# Patient Record
Sex: Female | Born: 1945 | Race: White | Hispanic: No | Marital: Married | Smoking: Never smoker
Health system: Southern US, Community
[De-identification: ages and names within clinical notes are randomized; demographics above are authoritative.]

## PROBLEM LIST (undated history)

## (undated) DIAGNOSIS — E119 Type 2 diabetes mellitus without complications: Secondary | ICD-10-CM

## (undated) DIAGNOSIS — I10 Essential (primary) hypertension: Secondary | ICD-10-CM

## (undated) DIAGNOSIS — E785 Hyperlipidemia, unspecified: Secondary | ICD-10-CM

## (undated) DIAGNOSIS — R0600 Dyspnea, unspecified: Secondary | ICD-10-CM

## (undated) DIAGNOSIS — R7302 Impaired glucose tolerance (oral): Secondary | ICD-10-CM

## (undated) DIAGNOSIS — I499 Cardiac arrhythmia, unspecified: Secondary | ICD-10-CM

## (undated) HISTORY — PX: EYE SURGERY: SHX253

## (undated) HISTORY — PX: COLONOSCOPY: SHX174

---

## 1994-10-03 HISTORY — PX: BREAST EXCISIONAL BIOPSY: SUR124

## 2005-02-24 ENCOUNTER — Ambulatory Visit: Payer: Self-pay | Admitting: Internal Medicine

## 2006-04-10 ENCOUNTER — Ambulatory Visit: Payer: Self-pay | Admitting: Internal Medicine

## 2007-12-04 ENCOUNTER — Ambulatory Visit: Payer: Self-pay | Admitting: Internal Medicine

## 2012-02-09 ENCOUNTER — Ambulatory Visit: Payer: Self-pay | Admitting: Family Medicine

## 2012-10-16 ENCOUNTER — Ambulatory Visit: Payer: Self-pay | Admitting: Unknown Physician Specialty

## 2012-10-17 LAB — PATHOLOGY REPORT

## 2013-02-12 ENCOUNTER — Ambulatory Visit: Payer: Self-pay | Admitting: Family Medicine

## 2017-03-08 ENCOUNTER — Other Ambulatory Visit: Payer: Self-pay | Admitting: Family Medicine

## 2017-03-08 DIAGNOSIS — Z1231 Encounter for screening mammogram for malignant neoplasm of breast: Secondary | ICD-10-CM

## 2017-03-09 ENCOUNTER — Ambulatory Visit
Admission: RE | Admit: 2017-03-09 | Discharge: 2017-03-09 | Disposition: A | Discharge status: Home / Self Care | Payer: Medicare Other | Source: Ambulatory Visit | Attending: Family Medicine | Admitting: Family Medicine

## 2017-03-09 ENCOUNTER — Encounter: Payer: Self-pay | Admitting: Radiology

## 2017-03-09 DIAGNOSIS — Z1231 Encounter for screening mammogram for malignant neoplasm of breast: Secondary | ICD-10-CM

## 2017-03-13 ENCOUNTER — Other Ambulatory Visit: Payer: Self-pay | Admitting: Family Medicine

## 2017-03-13 DIAGNOSIS — R921 Mammographic calcification found on diagnostic imaging of breast: Secondary | ICD-10-CM

## 2017-03-13 DIAGNOSIS — R928 Other abnormal and inconclusive findings on diagnostic imaging of breast: Secondary | ICD-10-CM

## 2017-03-21 ENCOUNTER — Ambulatory Visit
Admission: RE | Admit: 2017-03-21 | Discharge: 2017-03-21 | Disposition: A | Discharge status: Home / Self Care | Payer: Medicare Other | Source: Ambulatory Visit | Attending: Family Medicine | Admitting: Family Medicine

## 2017-03-21 DIAGNOSIS — R921 Mammographic calcification found on diagnostic imaging of breast: Secondary | ICD-10-CM | POA: Insufficient documentation

## 2017-03-21 DIAGNOSIS — R928 Other abnormal and inconclusive findings on diagnostic imaging of breast: Secondary | ICD-10-CM

## 2017-08-10 ENCOUNTER — Other Ambulatory Visit: Payer: Self-pay | Admitting: Family Medicine

## 2017-08-10 DIAGNOSIS — R921 Mammographic calcification found on diagnostic imaging of breast: Secondary | ICD-10-CM

## 2017-09-29 ENCOUNTER — Other Ambulatory Visit: Payer: Self-pay | Admitting: Family Medicine

## 2017-09-29 ENCOUNTER — Ambulatory Visit
Admission: RE | Admit: 2017-09-29 | Discharge: 2017-09-29 | Disposition: A | Discharge status: Home / Self Care | Payer: Medicare Other | Source: Ambulatory Visit | Attending: Family Medicine | Admitting: Family Medicine

## 2017-09-29 DIAGNOSIS — R921 Mammographic calcification found on diagnostic imaging of breast: Secondary | ICD-10-CM

## 2018-01-12 ENCOUNTER — Encounter: Payer: Self-pay | Admitting: *Deleted

## 2018-01-15 ENCOUNTER — Encounter: Payer: Self-pay | Admitting: *Deleted

## 2018-01-15 ENCOUNTER — Ambulatory Visit
Admission: RE | Admit: 2018-01-15 | Discharge: 2018-01-15 | Disposition: A | Discharge status: Home / Self Care | Payer: Medicare Other | Source: Ambulatory Visit | Attending: Unknown Physician Specialty | Admitting: Unknown Physician Specialty

## 2018-01-15 ENCOUNTER — Ambulatory Visit: Payer: Medicare Other | Admitting: Certified Registered"

## 2018-01-15 ENCOUNTER — Encounter
Admission: RE | Disposition: A | Discharge status: Home / Self Care | Payer: Self-pay | Source: Ambulatory Visit | Attending: Unknown Physician Specialty

## 2018-01-15 DIAGNOSIS — Z86010 Personal history of colonic polyps: Secondary | ICD-10-CM | POA: Diagnosis not present

## 2018-01-15 DIAGNOSIS — K640 First degree hemorrhoids: Secondary | ICD-10-CM | POA: Diagnosis not present

## 2018-01-15 DIAGNOSIS — E119 Type 2 diabetes mellitus without complications: Secondary | ICD-10-CM | POA: Diagnosis not present

## 2018-01-15 DIAGNOSIS — Z79899 Other long term (current) drug therapy: Secondary | ICD-10-CM | POA: Diagnosis not present

## 2018-01-15 DIAGNOSIS — K635 Polyp of colon: Secondary | ICD-10-CM | POA: Diagnosis not present

## 2018-01-15 DIAGNOSIS — Z1211 Encounter for screening for malignant neoplasm of colon: Secondary | ICD-10-CM | POA: Diagnosis not present

## 2018-01-15 DIAGNOSIS — K5730 Diverticulosis of large intestine without perforation or abscess without bleeding: Secondary | ICD-10-CM | POA: Diagnosis not present

## 2018-01-15 HISTORY — DX: Hyperlipidemia, unspecified: E78.5

## 2018-01-15 HISTORY — DX: Type 2 diabetes mellitus without complications: E11.9

## 2018-01-15 HISTORY — PX: COLONOSCOPY WITH PROPOFOL: SHX5780

## 2018-01-15 SURGERY — COLONOSCOPY WITH PROPOFOL
Anesthesia: General

## 2018-01-15 MED ORDER — SODIUM CHLORIDE 0.9 % IV SOLN
INTRAVENOUS | Status: DC
  Administered 2018-01-15: 1000 mL via INTRAVENOUS

## 2018-01-15 MED ORDER — LIDOCAINE HCL (CARDIAC) 20 MG/ML IV SOLN
INTRAVENOUS | Status: DC | PRN
  Administered 2018-01-15: 50 mg via INTRAVENOUS

## 2018-01-15 MED ORDER — PROPOFOL 10 MG/ML IV BOLUS
INTRAVENOUS | Status: DC | PRN
  Administered 2018-01-15: 60 mg via INTRAVENOUS
  Administered 2018-01-15 (×2): 20 mg via INTRAVENOUS

## 2018-01-15 MED ORDER — PROPOFOL 500 MG/50ML IV EMUL
INTRAVENOUS | Status: DC | PRN
  Administered 2018-01-15: 130 ug/kg/min via INTRAVENOUS

## 2018-01-15 MED ORDER — SODIUM CHLORIDE 0.9 % IV SOLN: INTRAVENOUS | Status: DC

## 2018-01-15 MED ORDER — PROPOFOL 500 MG/50ML IV EMUL
INTRAVENOUS | Status: AC
  Filled 2018-01-15: qty 50

## 2018-01-15 NOTE — Anesthesia Procedure Notes (Signed)
Performed by: Lillianah Swartzentruber, CRNA Pre-anesthesia Checklist: Patient identified, Emergency Drugs available, Suction available, Patient being monitored and Timeout performed Patient Re-evaluated:Patient Re-evaluated prior to induction Oxygen Delivery Method: Nasal cannula Induction Type: IV induction       

## 2018-01-15 NOTE — H&P (Signed)
Primary Care Physician:  Marina GoodellFeldpausch, Dale E, MD Primary Gastroenterologist:  Dr. Mechele CollinElliott  Pre-Procedure History & Physical: HPI:  Erin Cohen is a 72 y.o. female is here for an colonoscopy.  [facility]   Past Medical History:  Diagnosis Date  . Diabetes mellitus without complication (HCC)   . Lipids blood increased     Past Surgical History:  Procedure Laterality Date  . BREAST BIOPSY Right 1996   neg  . COLONOSCOPY      Prior to Admission medications   Medication Sig Start Date End Date Taking? Authorizing Provider  Biotin 1000 MCG CHEW Chew by mouth.   Yes [provider]  lovastatin (MEVACOR) 10 MG tablet Take 10 mg by mouth at bedtime.   Yes [provider]  Multiple Vitamin (MULTIVITAMIN) tablet Take 1 tablet by mouth daily.   Yes [provider]    Allergies as of 01/12/2018  . (No Known Allergies)    Family History  Problem Relation Age of Onset  . Breast cancer Cousin        pat cousin    Social History   Socioeconomic History  . Marital status: Married    Spouse name: Not on file  . Number of children: Not on file  . Years of education: Not on file  . Highest education level: Not on file  Occupational History  . Not on file  Social Needs  . Financial resource strain: Not on file  . Food insecurity:    Worry: Not on file    Inability: Not on file  . Transportation needs:    Medical: Not on file    Non-medical: Not on file  Tobacco Use  . Smoking status: Never Smoker  . Smokeless tobacco: Never Used  Substance and Sexual Activity  . Alcohol use: Never    Frequency: Never  . Drug use: Never  . Sexual activity: Not on file  Lifestyle  . Physical activity:    Days per week: Not on file    Minutes per session: Not on file  . Stress: Not on file  Relationships  . Social connections:    Talks on phone: Not on file    Gets together: Not on file    Attends religious service: Not on file    Active  member of club or organization: Not on file    Attends meetings of clubs or organizations: Not on file    Relationship status: Not on file  . Intimate partner violence:    Fear of current or ex partner: Not on file    Emotionally abused: Not on file    Physically abused: Not on file    Forced sexual activity: Not on file  Other Topics Concern  . Not on file  Social History Narrative  . Not on file    Review of Systems: See HPI, otherwise negative ROS  Physical Exam: Pulse 78   Temp 98.8 F (37.1 C) (Tympanic)   Resp 20   Ht 5\' 2"  (1.575 m)   Wt 70.3 kg (155 lb)   SpO2 97%   BMI 28.35 kg/m  General:   Alert,  pleasant and cooperative in NAD Head:  Normocephalic and atraumatic. Neck:  Supple; no masses or thyromegaly. Lungs:  Clear throughout to auscultation.    Heart:  Regular rate and rhythm. Abdomen:  Soft, nontender and nondistended. Normal bowel sounds, without guarding, and without rebound.   Neurologic:  Alert and  oriented  x4;  grossly normal neurologically.  Impression/Plan: Erin Cohen is here for an colonoscopy to be performed for [facility] colon polyps  Risks, benefits, limitations, and alternatives regarding  colonoscopy have been reviewed with the patient.  Questions have been answered.  All parties agreeable.   Lynnae Prude, MD  01/15/2018, 2:30 PM

## 2018-01-15 NOTE — Anesthesia Preprocedure Evaluation (Signed)
Anesthesia Evaluation  Patient identified by MRN, date of birth, ID band Patient awake    Reviewed: Allergy & Precautions, NPO status , Patient's Chart, lab work & pertinent test results, reviewed documented beta blocker date and time   Airway Mallampati: II  TM Distance: >3 FB     Dental  (+) Chipped   Pulmonary           Cardiovascular      Neuro/Psych    GI/Hepatic   Endo/Other  diabetes, Type 2  Renal/GU      Musculoskeletal   Abdominal   Peds  Hematology   Anesthesia Other Findings   Reproductive/Obstetrics                             Anesthesia Physical Anesthesia Plan  ASA: II  Anesthesia Plan: General   Post-op Pain Management:    Induction: Intravenous  PONV Risk Score and Plan:   Airway Management Planned:   Additional Equipment:   Intra-op Plan:   Post-operative Plan:   Informed Consent: I have reviewed the patients History and Physical, chart, labs and discussed the procedure including the risks, benefits and alternatives for the proposed anesthesia with the patient or authorized representative who has indicated his/her understanding and acceptance.     Plan Discussed with: CRNA  Anesthesia Plan Comments:         Anesthesia Quick Evaluation  

## 2018-01-15 NOTE — Transfer of Care (Signed)
Immediate Anesthesia Transfer of Care Note  Patient: Erin Cohen  Procedure(s) Performed: COLONOSCOPY WITH PROPOFOL (N/A )  Patient Location: PACU    Anesthesia Type:General  Level of Consciousness: sedated  Airway & Oxygen Therapy: Patient Spontanous Breathing and Patient connected to nasal cannula oxygen  Post-op Assessment: Report given to RN and Post -op Vital signs reviewed and stable  Post vital signs: Reviewed and stable  Last Vitals:  Vitals Value Taken Time  BP 129/60 01/15/2018  2:58 PM  Temp    Pulse 72 01/15/2018  2:59 PM  Resp 12 01/15/2018  2:59 PM  SpO2 99 % 01/15/2018  2:59 PM  Vitals shown include unvalidated device data.  Last Pain:  Vitals:   01/15/18 1354  TempSrc: Tympanic      Patients Stated Pain Goal: 0 (01/15/18 1354)  Complications: No apparent anesthesia complications

## 2018-01-15 NOTE — Anesthesia Postprocedure Evaluation (Signed)
Anesthesia Post Note  Patient: Erin Cohen  Procedure(s) Performed: COLONOSCOPY WITH PROPOFOL (N/A )  Patient location during evaluation: Endoscopy Anesthesia Type: General Level of consciousness: awake and alert Pain management: pain level controlled Vital Signs Assessment: post-procedure vital signs reviewed and stable Respiratory status: spontaneous breathing and respiratory function stable Cardiovascular status: stable Anesthetic complications: no     Last Vitals:  Vitals:   01/15/18 1459 01/15/18 1500  BP: 129/60   Pulse: 73 72  Resp: 12 12  Temp: (!) 36.1 C   SpO2: 99% 99%    Last Pain:  Vitals:   01/15/18 1459  TempSrc: Tympanic  PainSc: 0-No pain                 Adelaida Reindel K

## 2018-01-15 NOTE — Op Note (Signed)
[facility] Gastroenterology Patient Name: Erin HoyerKay Cohen Procedure Date: 01/15/2018 2:31 PM MRN: [id number] Account #: [id number] Date of Birth: 11/27/1945 Admit Type: Outpatient Age: [age] [facility] 1 Gender: Female Note Status: Finalized Procedure:            Colonoscopy Indications:          High risk colon cancer surveillance: Personal history                        of colonic polyps Providers:            Scot Junobert T. Elliott, MD Referring MD:         Marina Goodellale E. Feldpausch (Referring MD) Medicines:            Propofol per Anesthesia Complications:        No immediate complications. Procedure:            Pre-Anesthesia Assessment:                       - After reviewing the risks and benefits, the patient                        was deemed in satisfactory condition to undergo the                        procedure.                       After obtaining informed consent, the colonoscope was                        passed under direct vision. Throughout the procedure,                        the patient's blood pressure, pulse, and oxygen                        saturations were monitored continuously. The                        Colonoscope was introduced through the anus and                        advanced to the the cecum, identified by appendiceal                        orifice and ileocecal valve. The colonoscopy was                        performed without difficulty. The patient tolerated the                        procedure well. The quality of the bowel preparation                        was excellent. Findings:      A diminutive polyp was found in the ascending colon. The polyp was       sessile. The polyp was removed with a jumbo cold forceps. Resection and       retrieval were complete.      A few small-mouthed diverticula were  found in the sigmoid colon and       descending colon.      Internal hemorrhoids were found during endoscopy. The hemorrhoids  were       small and Grade I (internal hemorrhoids that do not prolapse).      The exam was otherwise without abnormality. Impression:           - One diminutive polyp in the ascending colon, removed                        with a jumbo cold forceps. Resected and retrieved.                       - Diverticulosis in the sigmoid colon and in the                        descending colon.                       - Internal hemorrhoids.                       - The examination was otherwise normal. Recommendation:       - Await pathology results. Procedure Code(s):    --- Professional ---                       [id number], Colonoscopy, flexible; with biopsy, single or                        multiple Diagnosis Code(s):    --- Professional ---                       Z86.010, Personal history of colonic polyps                       D12.2, Benign neoplasm of ascending colon                       K64.0, First degree hemorrhoids                       K57.30, Diverticulosis of large intestine without                        perforation or abscess without bleeding CPT copyright 2017 American Medical Association. All rights reserved. The codes documented in this report are preliminary and upon coder review may  be revised to meet current compliance requirements. Scot Jun, MD 01/15/2018 2:57:05 PM This report has been signed electronically. Number of Addenda: 0 Note Initiated On: 01/15/2018 2:31 PM Scope Withdrawal Time: 0 hours 8 minutes 37 seconds  Total Procedure Duration: 0 hours 16 minutes 55 seconds       [facility]

## 2018-01-15 NOTE — Anesthesia Post-op Follow-up Note (Signed)
Anesthesia QCDR form completed.        

## 2018-01-17 ENCOUNTER — Encounter: Payer: Self-pay | Admitting: Unknown Physician Specialty

## 2018-01-18 LAB — SURGICAL PATHOLOGY

## 2018-02-22 ENCOUNTER — Other Ambulatory Visit: Payer: Self-pay | Admitting: Family Medicine

## 2018-02-22 DIAGNOSIS — Z1231 Encounter for screening mammogram for malignant neoplasm of breast: Secondary | ICD-10-CM

## 2018-03-01 ENCOUNTER — Other Ambulatory Visit: Payer: Self-pay | Admitting: Family Medicine

## 2018-03-01 DIAGNOSIS — R921 Mammographic calcification found on diagnostic imaging of breast: Secondary | ICD-10-CM

## 2018-03-01 DIAGNOSIS — Z1231 Encounter for screening mammogram for malignant neoplasm of breast: Secondary | ICD-10-CM

## 2018-04-02 ENCOUNTER — Other Ambulatory Visit: Payer: Self-pay | Admitting: Family Medicine

## 2018-04-02 ENCOUNTER — Ambulatory Visit
Admission: RE | Admit: 2018-04-02 | Discharge: 2018-04-02 | Disposition: A | Discharge status: Home / Self Care | Payer: Medicare Other | Source: Ambulatory Visit | Attending: Family Medicine | Admitting: Family Medicine

## 2018-04-02 DIAGNOSIS — Z1231 Encounter for screening mammogram for malignant neoplasm of breast: Secondary | ICD-10-CM

## 2018-04-02 DIAGNOSIS — R921 Mammographic calcification found on diagnostic imaging of breast: Secondary | ICD-10-CM

## 2019-05-10 ENCOUNTER — Other Ambulatory Visit: Payer: Self-pay | Admitting: Family Medicine

## 2019-05-10 DIAGNOSIS — Z1231 Encounter for screening mammogram for malignant neoplasm of breast: Secondary | ICD-10-CM

## 2019-05-24 ENCOUNTER — Other Ambulatory Visit: Payer: Self-pay | Admitting: Family Medicine

## 2019-05-24 DIAGNOSIS — Z1231 Encounter for screening mammogram for malignant neoplasm of breast: Secondary | ICD-10-CM

## 2019-05-28 ENCOUNTER — Other Ambulatory Visit: Payer: Self-pay | Admitting: Family Medicine

## 2019-05-28 DIAGNOSIS — R921 Mammographic calcification found on diagnostic imaging of breast: Secondary | ICD-10-CM

## 2019-07-02 ENCOUNTER — Ambulatory Visit
Admission: RE | Admit: 2019-07-02 | Discharge: 2019-07-02 | Disposition: A | Discharge status: Home / Self Care | Payer: Medicare Other | Source: Ambulatory Visit | Attending: Family Medicine | Admitting: Family Medicine

## 2019-07-02 DIAGNOSIS — R921 Mammographic calcification found on diagnostic imaging of breast: Secondary | ICD-10-CM | POA: Diagnosis not present

## 2020-12-03 IMAGING — MG MM DIGITAL DIAGNOSTIC BILAT W/ TOMO W/ CAD
6 of 10 series · 6 of 26 positions shown · non-contrast
Comparison: Previous exam(s).

CLINICAL DATA: Patient presents for bilateral diagnostic
examination to follow-up right breast microcalcifications.

EXAM:
DIGITAL DIAGNOSTIC BILATERAL MAMMOGRAM WITH CAD AND TOMO

[R ML]
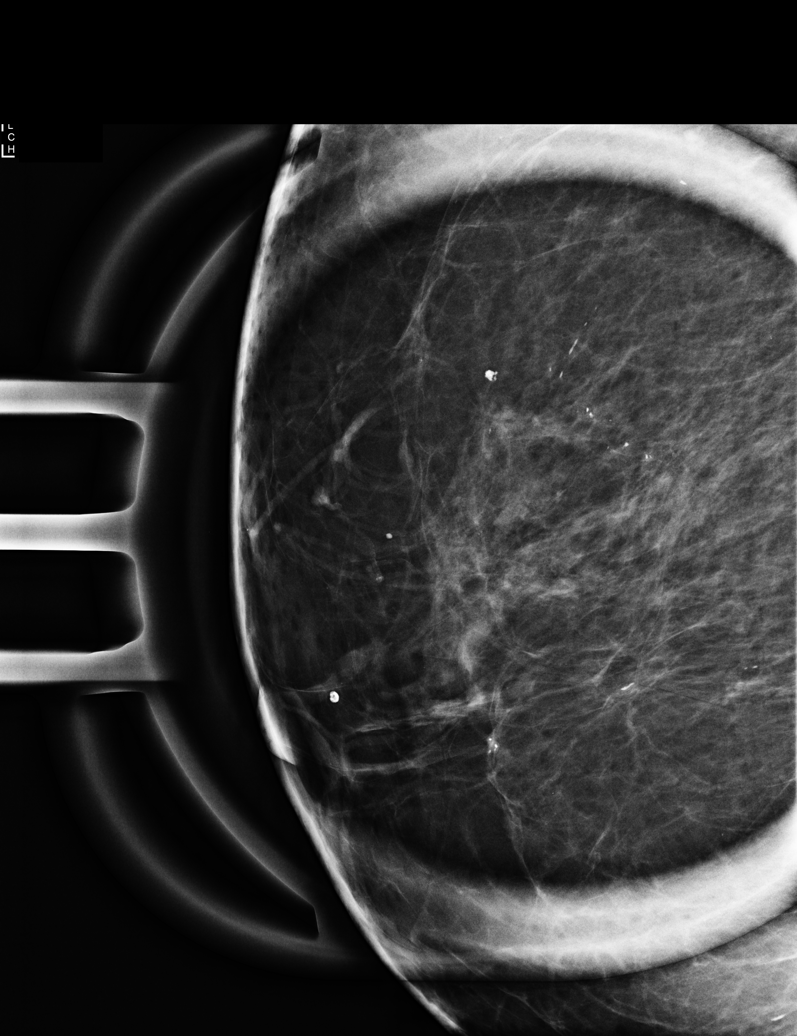

[R CC]
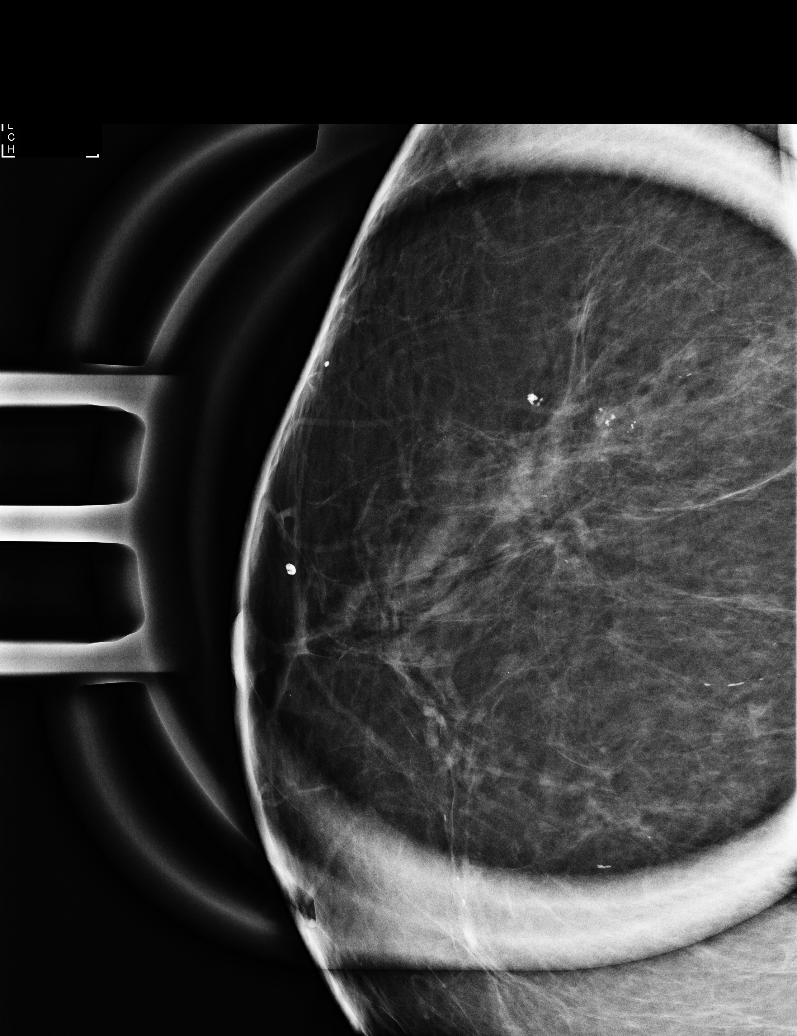

[L MLO synth-2D]
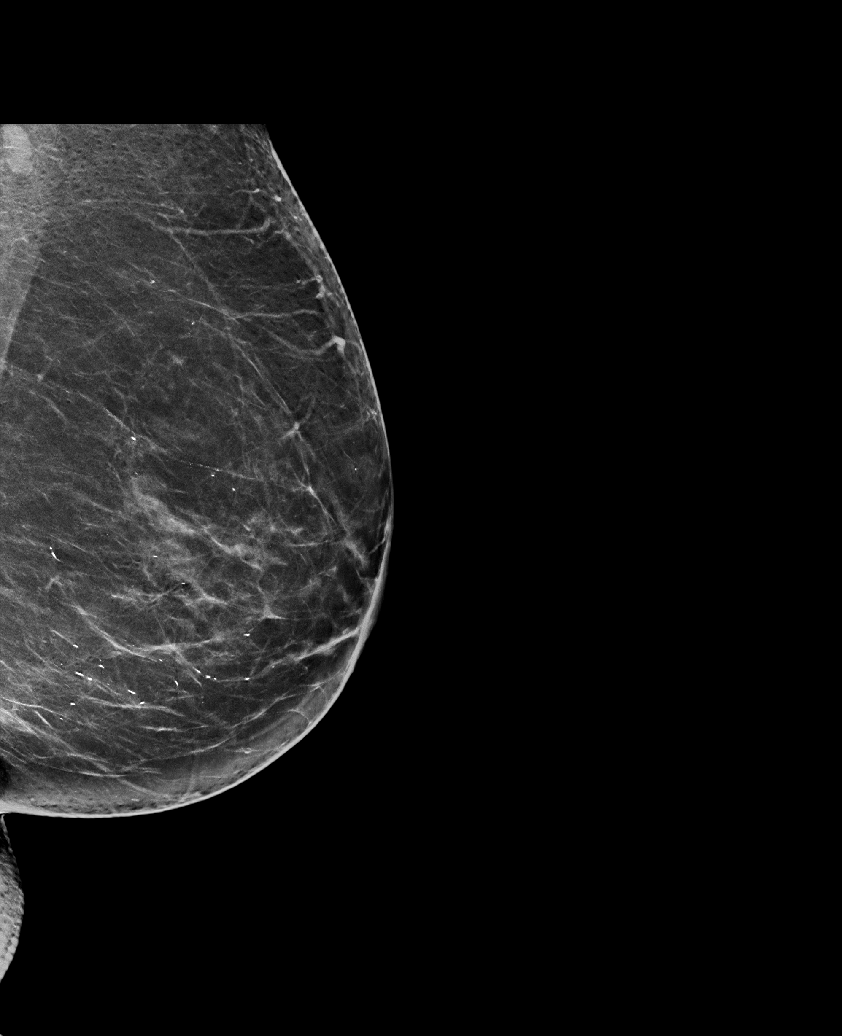

[R CC synth-2D]
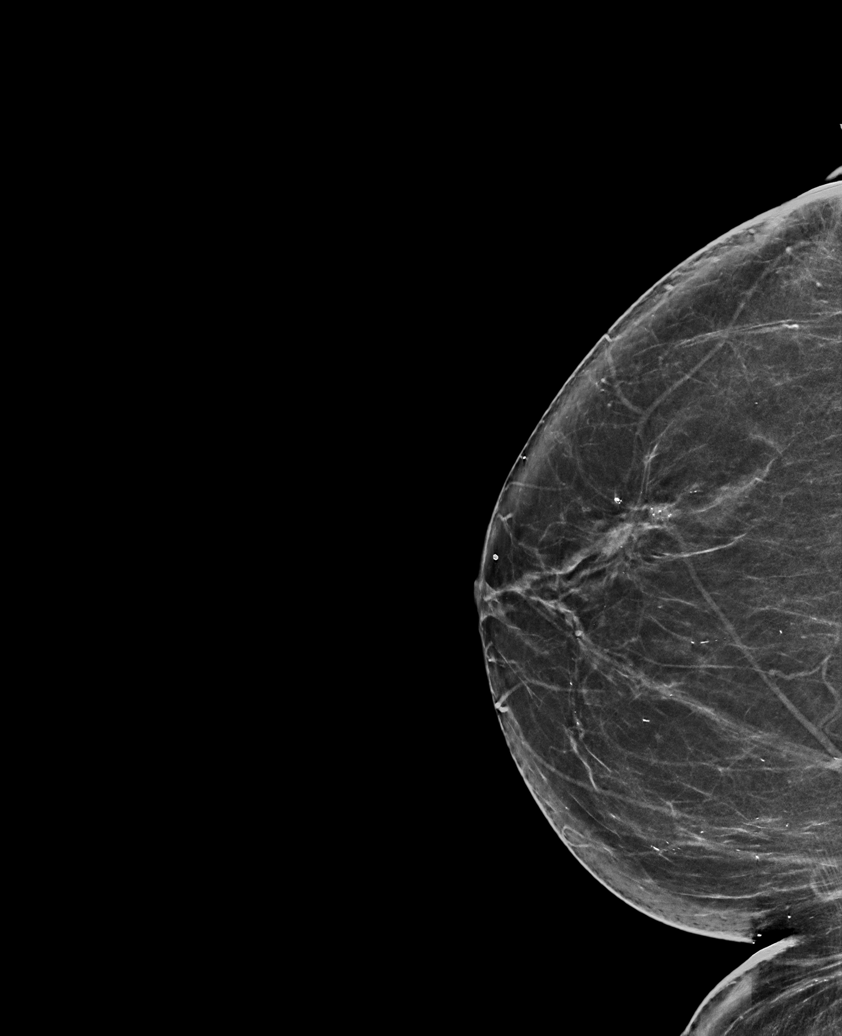

[R MLO synth-2D]
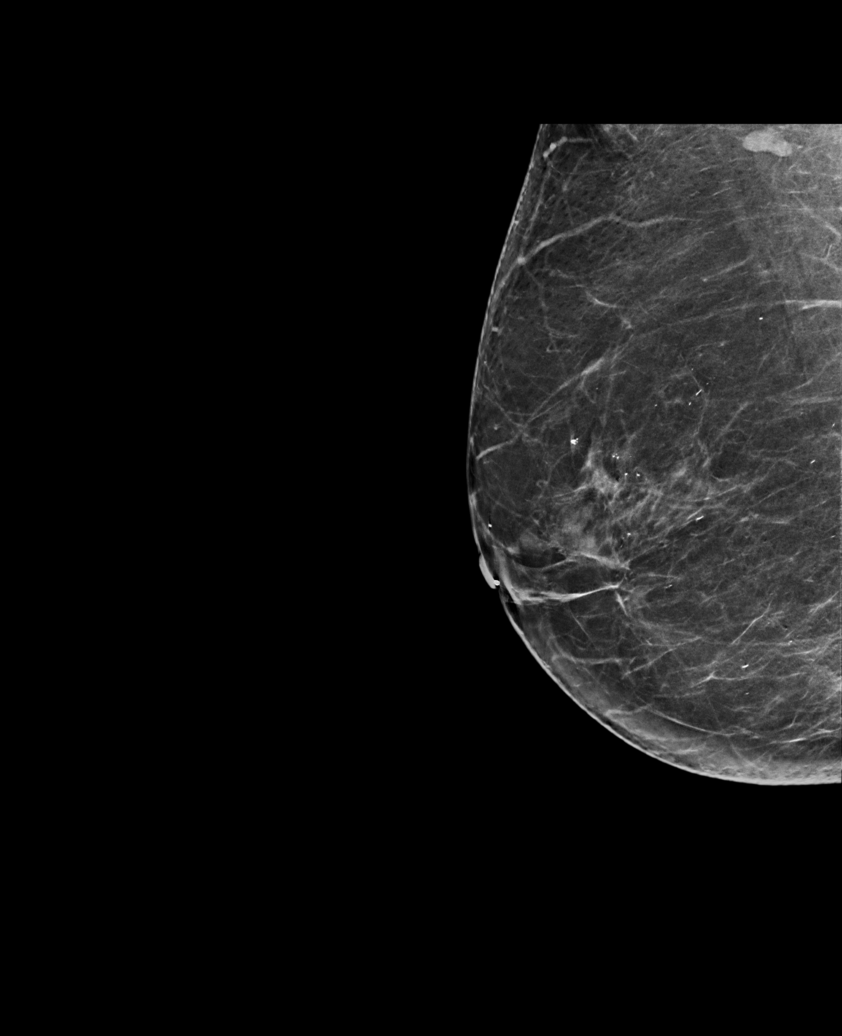

[L CC synth-2D]
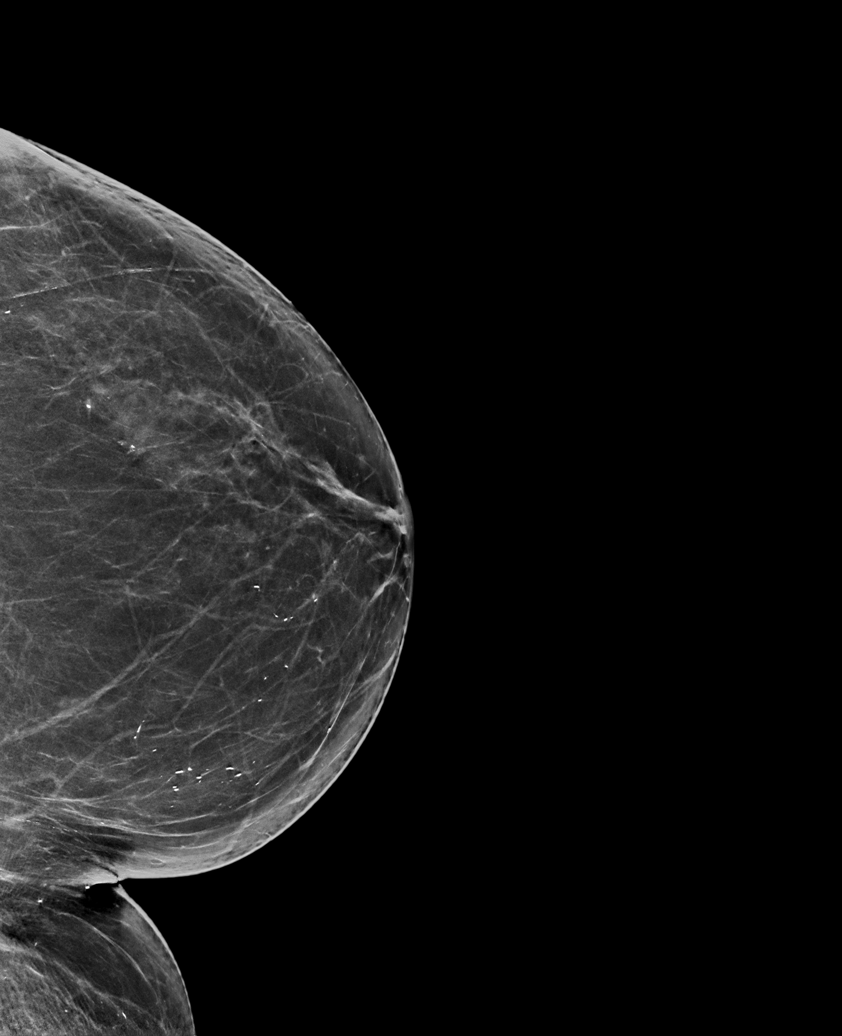

[6 of 26 positions shown; findings below may reference images not displayed]

ACR Breast Density Category b: There are scattered areas of
fibroglandular density.
FINDINGS: Examination demonstrates a stable group of microcalcifications over
the upper outer right breast in the area of a previous excisional
biopsy site. These have been stable for greater than 2 years and
therefore are considered benign. Remainder of the right breast as
well as the left breast is unchanged.

Mammographic images were processed with CAD.
IMPRESSION: Right breast microcalcifications stable for greater than 2 years and
therefore considered benign.

RECOMMENDATION:
Recommend continued annual bilateral screening mammographic
follow-up.

I have discussed the findings and recommendations with the patient.
If applicable, a reminder letter will be sent to the patient
regarding the next appointment.

BI-RADS CATEGORY  2: Benign.

## 2022-10-03 HISTORY — PX: PACEMAKER IMPLANT: EP1218

## 2023-07-14 ENCOUNTER — Encounter: Payer: Self-pay | Admitting: *Deleted

## 2023-07-24 ENCOUNTER — Ambulatory Visit
Admission: RE | Admit: 2023-07-24 | Discharge: 2023-07-24 | Disposition: A | Discharge status: Home / Self Care | Payer: Medicare Other | Attending: Gastroenterology | Admitting: Gastroenterology

## 2023-07-24 ENCOUNTER — Ambulatory Visit: Payer: Medicare Other | Admitting: Anesthesiology

## 2023-07-24 ENCOUNTER — Encounter
Admission: RE | Disposition: A | Discharge status: Home / Self Care | Payer: Self-pay | Source: Home / Self Care | Attending: Gastroenterology

## 2023-07-24 ENCOUNTER — Encounter: Payer: Self-pay | Admitting: *Deleted

## 2023-07-24 DIAGNOSIS — I10 Essential (primary) hypertension: Secondary | ICD-10-CM | POA: Insufficient documentation

## 2023-07-24 DIAGNOSIS — D175 Benign lipomatous neoplasm of intra-abdominal organs: Secondary | ICD-10-CM | POA: Diagnosis not present

## 2023-07-24 DIAGNOSIS — E785 Hyperlipidemia, unspecified: Secondary | ICD-10-CM | POA: Insufficient documentation

## 2023-07-24 DIAGNOSIS — K640 First degree hemorrhoids: Secondary | ICD-10-CM | POA: Diagnosis not present

## 2023-07-24 DIAGNOSIS — D123 Benign neoplasm of transverse colon: Secondary | ICD-10-CM | POA: Insufficient documentation

## 2023-07-24 DIAGNOSIS — Z1211 Encounter for screening for malignant neoplasm of colon: Secondary | ICD-10-CM | POA: Diagnosis present

## 2023-07-24 DIAGNOSIS — K5730 Diverticulosis of large intestine without perforation or abscess without bleeding: Secondary | ICD-10-CM | POA: Insufficient documentation

## 2023-07-24 DIAGNOSIS — E119 Type 2 diabetes mellitus without complications: Secondary | ICD-10-CM | POA: Diagnosis not present

## 2023-07-24 HISTORY — PX: POLYPECTOMY: SHX5525

## 2023-07-24 HISTORY — PX: COLONOSCOPY WITH PROPOFOL: SHX5780

## 2023-07-24 HISTORY — DX: Hyperlipidemia, unspecified: E78.5

## 2023-07-24 HISTORY — DX: Cardiac arrhythmia, unspecified: I49.9

## 2023-07-24 HISTORY — DX: Impaired glucose tolerance (oral): R73.02

## 2023-07-24 HISTORY — DX: Dyspnea, unspecified: R06.00

## 2023-07-24 HISTORY — DX: Essential (primary) hypertension: I10

## 2023-07-24 LAB — GLUCOSE, CAPILLARY: Glucose-Capillary: 100 mg/dL — ABNORMAL HIGH (ref 70–99)

## 2023-07-24 SURGERY — COLONOSCOPY WITH PROPOFOL
Anesthesia: General

## 2023-07-24 MED ORDER — SODIUM CHLORIDE 0.9 % IV SOLN: INTRAVENOUS | Status: DC

## 2023-07-24 MED ORDER — LIDOCAINE HCL (CARDIAC) PF 100 MG/5ML IV SOSY
PREFILLED_SYRINGE | INTRAVENOUS | Status: DC | PRN
  Administered 2023-07-24: 50 mg via INTRAVENOUS

## 2023-07-24 MED ORDER — PROPOFOL 500 MG/50ML IV EMUL
INTRAVENOUS | Status: DC | PRN
  Administered 2023-07-24: 80 mg via INTRAVENOUS
  Administered 2023-07-24: 150 ug/kg/min via INTRAVENOUS

## 2023-07-24 NOTE — Op Note (Signed)
[facility] Gastroenterology Patient Name: Erin Cohen Procedure Date: 07/24/2023 12:04 PM MRN: [id number] Account #: [id number] Date of Birth: [birth year] Admit Type: Outpatient Age: 77 Room: [facility] ENDO ROOM 3 Gender: Female Note Status: Finalized Instrument Name: Prentice Docker [id number] Procedure:             Colonoscopy Indications:           High risk colon cancer surveillance: Personal history                         of colonic polyps, Last colonoscopy 5 years ago Providers:             Eather Colas MD, MD Referring MD:          Marina Goodell (Referring MD) Medicines:             Monitored Anesthesia Care Complications:         No immediate complications. Estimated blood loss:                         Minimal. Procedure:             Pre-Anesthesia Assessment:                        - Prior to the procedure, a History and Physical was                         performed, and patient medications and allergies were                         reviewed. The patient is competent. The risks and                         benefits of the procedure and the sedation options and                         risks were discussed with the patient. All questions                         were answered and informed consent was obtained.                         Patient identification and proposed procedure were                         verified by the physician, the nurse, the                         anesthesiologist, the anesthetist and the technician                         in the endoscopy suite. Mental Status Examination:                         alert and oriented. Airway Examination: normal                         oropharyngeal airway and neck mobility. Respiratory  Examination: clear to auscultation. CV Examination:                         normal. Prophylactic Antibiotics: The patient does not                         require prophylactic antibiotics. Prior                          Anticoagulants: The patient has taken no anticoagulant                         or antiplatelet agents. ASA Grade Assessment: II - A                         patient with mild systemic disease. After reviewing                         the risks and benefits, the patient was deemed in                         satisfactory condition to undergo the procedure. The                         anesthesia plan was to use monitored anesthesia care                         (MAC). Immediately prior to administration of                         medications, the patient was re-assessed for adequacy                         to receive sedatives. The heart rate, respiratory                         rate, oxygen saturations, blood pressure, adequacy of                         pulmonary ventilation, and response to care were                         monitored throughout the procedure. The physical                         status of the patient was re-assessed after the                         procedure.                        After obtaining informed consent, the colonoscope was                         passed under direct vision. Throughout the procedure,                         the patient's blood pressure, pulse, and oxygen  saturations were monitored continuously. The                         Colonoscope was introduced through the anus and                         advanced to the the cecum, identified by appendiceal                         orifice and ileocecal valve. The colonoscopy was                         performed without difficulty. The patient tolerated                         the procedure well. The quality of the bowel                         preparation was fair. The ileocecal valve, appendiceal                         orifice, and rectum were photographed. Findings:      The perianal and digital rectal examinations were normal.      There was a small lipoma,  10 mm in diameter, at the hepatic flexure.      Three sessile polyps were found in the transverse colon. The polyps were       2 to 4 mm in size. These polyps were removed with a cold snare.       Resection and retrieval were complete. Estimated blood loss was minimal.      Multiple small-mouthed diverticula were found in the sigmoid colon.      Internal hemorrhoids were found during retroflexion. The hemorrhoids       were Grade I (internal hemorrhoids that do not prolapse).      The exam was otherwise without abnormality on direct and retroflexion       views. Impression:            - Preparation of the colon was fair.                        - Small lipoma at the hepatic flexure.                        - Three 2 to 4 mm polyps in the transverse colon,                         removed with a cold snare. Resected and retrieved.                        - Diverticulosis in the sigmoid colon.                        - Internal hemorrhoids.                        - The examination was otherwise normal on direct and                         retroflexion views. Recommendation:        -  Discharge patient to home.                        - Resume previous diet.                        - Continue present medications.                        - Await pathology results.                        - Repeat colonoscopy is not recommended due to current                         age (62 years or older) for surveillance.                        - Return to referring physician as previously                         scheduled. Procedure Code(s):     --- Professional ---                        [phone], Colonoscopy, flexible; with removal of                         tumor(s), polyp(s), or other lesion(s) by snare                         technique Diagnosis Code(s):     --- Professional ---                        Z86.010, Personal history of colonic polyps                        K64.0, First degree hemorrhoids                         D17.5, Benign lipomatous neoplasm of intra-abdominal                         organs                        D12.3, Benign neoplasm of transverse colon (hepatic                         flexure or splenic flexure)                        K57.30, Diverticulosis of large intestine without                         perforation or abscess without bleeding CPT copyright 2022 American Medical Association. All rights reserved. The codes documented in this report are preliminary and upon coder review may  be revised to meet current compliance requirements. Eather Colas MD, MD 07/24/2023 12:35:17 PM Number of Addenda: 0 Note Initiated On: 07/24/2023 12:04 PM Scope Withdrawal Time: 0 hours 5 minutes 41 seconds  Total Procedure Duration: 0 hours 16 minutes 57 seconds  Estimated Blood Loss:  Estimated blood loss was minimal.      [facility]

## 2023-07-24 NOTE — Anesthesia Postprocedure Evaluation (Signed)
Anesthesia Post Note  Patient: Erin Cohen  Procedure(s) Performed: COLONOSCOPY WITH PROPOFOL POLYPECTOMY  Patient location during evaluation: Endoscopy Anesthesia Type: General Level of consciousness: awake and alert Pain management: pain level controlled Vital Signs Assessment: post-procedure vital signs reviewed and stable Respiratory status: spontaneous breathing, nonlabored ventilation, respiratory function stable and patient connected to nasal cannula oxygen Cardiovascular status: blood pressure returned to baseline and stable Postop Assessment: no apparent nausea or vomiting Anesthetic complications: no   No notable events documented.   Last Vitals:  Vitals:   07/24/23 1245 07/24/23 1255  BP: (!) 133/56 (!) 153/54  Pulse: 70 64  Resp:    Temp:    SpO2: 99% 99%    Last Pain:  Vitals:   07/24/23 1245  TempSrc:   PainSc: 0-No pain                 Cleda Mccreedy Schae Cando

## 2023-07-24 NOTE — Transfer of Care (Signed)
Immediate Anesthesia Transfer of Care Note  Patient: Erin Cohen  Procedure(s) Performed: COLONOSCOPY WITH PROPOFOL POLYPECTOMY  Patient Location: PACU and Endoscopy Unit  Anesthesia Type:General  Level of Consciousness: drowsy and patient cooperative  Airway & Oxygen Therapy: Patient Spontanous Breathing  Post-op Assessment: Report given to RN and Post -op Vital signs reviewed and stable  Post vital signs: Reviewed and stable  Last Vitals:  Vitals Value Taken Time  BP 117/43 07/24/23 1235  Temp 35.9 C 07/24/23 1234  Pulse 62 07/24/23 1236  Resp    SpO2 98 % 07/24/23 1236  Vitals shown include unfiled device data.  Last Pain:  Vitals:   07/24/23 1234  TempSrc: Temporal  PainSc: Asleep         Complications: No notable events documented.

## 2023-07-24 NOTE — H&P (Signed)
Outpatient short stay form Pre-procedure 07/24/2023  Regis Bill, MD  Primary Physician: Marina Goodell, MD  Reason for visit:  Surveillance colonoscopy  History of present illness:    77 y/o lady with history of hypertension here for colonoscopy. Last colonoscopy in 2019 was unremarkable. Has had polyps previously. No blood thinners. No family history of GI malignancies. No significant abdominal surgeries.    Current Facility-Administered Medications:    0.9 %  sodium chloride infusion, , Intravenous, Continuous, Kimball Appleby, Rossie Muskrat, MD, Last Rate: 20 mL/hr at 07/24/23 1041, New Bag at 07/24/23 1041  Medications Prior to Admission  Medication Sig Dispense Refill Last Dose   amLODipine (NORVASC) 5 MG tablet Take 5 mg by mouth daily.   07/24/2023   latanoprost (XALATAN) 0.005 % ophthalmic solution Place 1 drop into both eyes at bedtime.      lovastatin (MEVACOR) 10 MG tablet Take 10 mg by mouth at bedtime.   Past Week   magnesium oxide (MAG-OX) 400 (240 Mg) MG tablet Take 400 mg by mouth daily.      Multiple Vitamin (MULTIVITAMIN) tablet Take 1 tablet by mouth daily.   Past Week   Na Sulfate-K Sulfate-Mg Sulf (SUPREP BOWEL PREP KIT) 17.5-3.13-1.6 GM/177ML SOLN Take by mouth as directed.   07/23/2023   Biotin 1000 MCG CHEW Chew by mouth.        No Known Allergies   Past Medical History:  Diagnosis Date   Dyspnea    Dysrhythmia    Hyperlipidemia    Hypertension    Impaired glucose tolerance    Lipids blood increased     Review of systems:  Otherwise negative.    Physical Exam  Gen: Alert, oriented. Appears stated age.  HEENT: PERRLA. Lungs: No respiratory distress CV: RRR Abd: soft, benign, no masses Ext: No edema    Planned procedures: Proceed with colonoscopy. The patient understands the nature of the planned procedure, indications, risks, alternatives and potential complications including but not limited to bleeding, infection, perforation, damage to  internal organs and possible oversedation/side effects from anesthesia. The patient agrees and gives consent to proceed.  Please refer to procedure notes for findings, recommendations and patient disposition/instructions.     Regis Bill, MD [facility] Gastroenterology

## 2023-07-24 NOTE — Anesthesia Preprocedure Evaluation (Signed)
Anesthesia Evaluation  Patient identified by MRN, date of birth, ID band Patient awake    Reviewed: Allergy & Precautions, NPO status , Patient's Chart, lab work & pertinent test results  History of Anesthesia Complications Negative for: history of anesthetic complications  Airway Mallampati: III  TM Distance: >3 FB Neck ROM: full    Dental  (+) Chipped   Pulmonary neg pulmonary ROS, neg shortness of breath   Pulmonary exam normal        Cardiovascular Exercise Tolerance: Good hypertension, Normal cardiovascular exam+ dysrhythmias      Neuro/Psych negative neurological ROS  negative psych ROS   GI/Hepatic negative GI ROS, Neg liver ROS,neg GERD  ,,  Endo/Other  diabetes, Type 2    Renal/GU negative Renal ROS  negative genitourinary   Musculoskeletal   Abdominal   Peds  Hematology negative hematology ROS (+)   Anesthesia Other Findings Past Medical History: No date: Diabetes mellitus without complication (HCC) No date: Dyspnea No date: Dysrhythmia No date: Hyperlipidemia No date: Hypertension No date: Lipids blood increased  Past Surgical History: 1996: BREAST EXCISIONAL BIOPSY; Right     Comment:  neg No date: COLONOSCOPY 01/15/2018: COLONOSCOPY WITH PROPOFOL; N/A     Comment:  Procedure: COLONOSCOPY WITH PROPOFOL;  Surgeon: Scot Jun, MD;  Location: [facility] ENDOSCOPY;  Service:               Endoscopy;  Laterality: N/A; No date: EYE SURGERY 10/2022: PACEMAKER IMPLANT  BMI    Body Mass Index: 28.20 kg/m      Reproductive/Obstetrics negative OB ROS                             Anesthesia Physical Anesthesia Plan  ASA: 2  Anesthesia Plan: General   Post-op Pain Management:    Induction: Intravenous  PONV Risk Score and Plan: Propofol infusion and TIVA  Airway Management Planned: Natural Airway and Nasal Cannula  Additional Equipment:    Intra-op Plan:   Post-operative Plan:   Informed Consent: I have reviewed the patients History and Physical, chart, labs and discussed the procedure including the risks, benefits and alternatives for the proposed anesthesia with the patient or authorized representative who has indicated his/her understanding and acceptance.     Dental Advisory Given  Plan Discussed with: Anesthesiologist, CRNA and Surgeon  Anesthesia Plan Comments: (Patient consented for risks of anesthesia including but not limited to:  - adverse reactions to medications - risk of airway placement if required - damage to eyes, teeth, lips or other oral mucosa - nerve damage due to positioning  - sore throat or hoarseness - Damage to heart, brain, nerves, lungs, other parts of body or loss of life  Patient voiced understanding and assent.)       Anesthesia Quick Evaluation

## 2023-07-24 NOTE — Interval H&P Note (Signed)
History and Physical Interval Note:  07/24/2023 12:05 PM  Erin Cohen  has presented today for surgery, with the diagnosis of h/o colon polyps.  The various methods of treatment have been discussed with the patient and family. After consideration of risks, benefits and other options for treatment, the patient has consented to  Procedure(s): COLONOSCOPY WITH PROPOFOL (N/A) as a surgical intervention.  The patient's history has been reviewed, patient examined, no change in status, stable for surgery.  I have reviewed the patient's chart and labs.  Questions were answered to the patient's satisfaction.     Regis Bill  Ok to proceed with colonoscopy

## 2023-07-25 ENCOUNTER — Encounter: Payer: Self-pay | Admitting: Gastroenterology

## 2023-07-28 LAB — SURGICAL PATHOLOGY

## 2023-08-23 ENCOUNTER — Ambulatory Visit (INDEPENDENT_AMBULATORY_CARE_PROVIDER_SITE_OTHER): Payer: Medicare Other | Admitting: Dermatology

## 2023-08-23 ENCOUNTER — Encounter: Payer: Self-pay | Admitting: Dermatology

## 2023-08-23 DIAGNOSIS — L720 Epidermal cyst: Secondary | ICD-10-CM | POA: Diagnosis not present

## 2023-08-23 DIAGNOSIS — L905 Scar conditions and fibrosis of skin: Secondary | ICD-10-CM

## 2023-08-23 DIAGNOSIS — D492 Neoplasm of unspecified behavior of bone, soft tissue, and skin: Secondary | ICD-10-CM | POA: Diagnosis not present

## 2023-08-23 DIAGNOSIS — D485 Neoplasm of uncertain behavior of skin: Secondary | ICD-10-CM

## 2023-08-23 NOTE — Patient Instructions (Addendum)
Wound Care Instructions  Cleanse wound gently with soap and water once a day then pat dry with clean gauze. Apply a thin coat of Petrolatum (petroleum jelly, "Vaseline") over the wound (unless you have an allergy to this). We recommend that you use a new, sterile tube of Vaseline. Do not pick or remove scabs. Do not remove the yellow or white "healing tissue" from the base of the wound.  Cover the wound with fresh, clean, nonstick gauze and secure with paper tape. You may use Band-Aids in place of gauze and tape if the wound is small enough, but would recommend trimming much of the tape off as there is often too much. Sometimes Band-Aids can irritate the skin.  You should call the office for your biopsy report after 1 week if you have not already been contacted.  If you experience any problems, such as abnormal amounts of bleeding, swelling, significant bruising, significant pain, or evidence of infection, please call the office immediately.  FOR ADULT SURGERY PATIENTS: If you need something for pain relief you may take 1 extra strength Tylenol (acetaminophen) AND 2 Ibuprofen (200mg  each) together every 4 hours as needed for pain. (do not take these if you are allergic to them or if you have a reason you should not take them.) Typically, you may only need pain medication for 1 to 3 days.   Due to recent changes in healthcare laws, you may see results of your pathology and/or laboratory studies on MyChart before the doctors have had a chance to review them. We understand that in some cases there may be results that are confusing or concerning to you. Please understand that not all results are received at the same time and often the doctors may need to interpret multiple results in order to provide you with the best plan of care or course of treatment. Therefore, we ask that you please give [place] 2 business days to thoroughly review all your results before contacting the office for clarification. Should we  see a critical lab result, you will be contacted sooner.   If You Need Anything After Your Visit  If you have any questions or concerns for your doctor, please call our main line at [phone] and press option 4 to reach your doctor's medical assistant. If no one answers, please leave a voicemail as directed and we will return your call as soon as possible. Messages left after 4 pm will be answered the following business day.   You may also send [place] a message via MyChart. We typically respond to MyChart messages within 1-2 business days.  For prescription refills, please ask your pharmacy to contact our office. Our fax number is [phone].  If you have an urgent issue when the clinic is closed that cannot wait until the next business day, you can page your doctor at the number below.    Please note that while we do our best to be available for urgent issues outside of office hours, we are not available 24/7.   If you have an urgent issue and are unable to reach [place], you may choose to seek medical care at your doctor's office, retail clinic, urgent care center, or emergency room.  If you have a medical emergency, please immediately call 911 or go to the emergency department.  Pager Numbers  - Dr. Gwen Pounds: [phone]  - Dr. Roseanne Reno: [phone]  - Dr. Katrinka Blazing: [phone]   In the event of inclement weather, please call our main line at [phone]  for an update on the status of any delays or closures.  Dermatology Medication Tips: Please keep the boxes that topical medications come in in order to help keep track of the instructions about where and how to use these. Pharmacies typically print the medication instructions only on the boxes and not directly on the medication tubes.   If your medication is too expensive, please contact our office at [phone] option 4 or send [place] a message through MyChart.   We are unable to tell what your co-pay for medications will be in  advance as this is different depending on your insurance coverage. However, we may be able to find a substitute medication at lower cost or fill out paperwork to get insurance to cover a needed medication.   If a prior authorization is required to get your medication covered by your insurance company, please allow [place] 1-2 business days to complete this process.  Drug prices often vary depending on where the prescription is filled and some pharmacies may offer cheaper prices.  The website www.goodrx.com contains coupons for medications through different pharmacies. The prices here do not account for what the cost may be with help from insurance (it may be cheaper with your insurance), but the website can give you the price if you did not use any insurance.  - You can print the associated coupon and take it with your prescription to the pharmacy.  - You may also stop by our office during regular business hours and pick up a GoodRx coupon card.  - If you need your prescription sent electronically to a different pharmacy, notify our office through [facility] or by phone at [phone] option 4.

## 2023-08-23 NOTE — Progress Notes (Signed)
   New Patient Visit   Subjective  Erin Cohen is a 77 y.o. female who presents for the following: spot at left lower leg. Came up 3-4 months ago, is sore if she hits it. Patient was using Vaseline and Neosporin on it but has stopped and it has gotten better.  No hx skin cancer.  The patient has spots, moles and lesions to be evaluated, some may be new or changing and the patient may have concern these could be cancer.   The following portions of the chart were reviewed this encounter and updated as appropriate: medications, allergies, medical history  Review of Systems:  No other skin or systemic complaints except as noted in HPI or Assessment and Plan.  Objective  Well appearing patient in no apparent distress; mood and affect are within normal limits.   A focused examination was performed of the following areas: Left leg, neck  Relevant exam findings are noted in the Assessment and Plan.  Left Lower Leg - lateral 5 mm pink papule with scar-like appearance and hemorrhagic crusting       Assessment & Plan   EPIDERMAL INCLUSION CYST Exam: Subcutaneous nodule at anterior neck  Benign-appearing. Exam most consistent with an epidermal inclusion cyst. Discussed that a cyst is a benign growth that can grow over time and sometimes get irritated or inflamed. Recommend observation if it is not bothersome. Discussed option of surgical excision to remove it if it is growing, symptomatic, or other changes noted. Please call for new or changing lesions so they can be evaluated.    Neoplasm of uncertain behavior of skin Left Lower Leg - lateral  Skin / nail biopsy Type of biopsy: tangential   Informed consent: discussed and consent obtained   Timeout: patient name, date of birth, surgical site, and procedure verified   Procedure prep:  Patient was prepped and draped in usual sterile fashion Prep type:  Isopropyl alcohol Anesthesia: the lesion was anesthetized in a standard  fashion   Anesthetic:  1% lidocaine w/ epinephrine 1-100,000 buffered w/ 8.4% NaHCO3 Instrument used: DermaBlade   Hemostasis achieved with: pressure and aluminum chloride   Outcome: patient tolerated procedure well   Post-procedure details: sterile dressing applied and wound care instructions given   Dressing type: bandage and petrolatum    Specimen 1 - Surgical pathology Differential Diagnosis: r/o SCC vs BCC vs prurigo nodule  Check Margins: No 5 mm pink papule with scar-like appearance and hemorrhagic crusting    Return if symptoms worsen or fail to improve.  Anise Salvo, RMA, am acting as scribe for Elie Goody, MD .   Documentation: I have reviewed the above documentation for accuracy and completeness, and I agree with the above.  Elie Goody, MD

## 2023-08-28 LAB — SURGICAL PATHOLOGY

## 2023-08-29 ENCOUNTER — Telehealth: Payer: Self-pay

## 2023-08-29 NOTE — Telephone Encounter (Signed)
Discussed pathology results. Patient voiced understanding.

## 2023-08-29 NOTE — Telephone Encounter (Signed)
-----   Message from [place] sent at 08/28/2023  9:23 PM EST ----- Diagnosis: Left lower leg - lateral :       FIBROSIS, NO EVIDENCE OF MALIGNANCY    Plan: please call to share that biopsy revealed scar with no skin cancer. Not treatment needed. Thank you
